# Patient Record
Sex: Female | Born: 1945 | Race: White | Hispanic: No | Marital: Married
Health system: Southern US, Community
[De-identification: ages and names within clinical notes are randomized; demographics above are authoritative.]

---

## 2004-07-13 ENCOUNTER — Ambulatory Visit (HOSPITAL_COMMUNITY): Admission: RE | Admit: 2004-07-13 | Discharge: 2004-07-13 | Payer: Self-pay | Admitting: Surgery

## 2004-07-13 ENCOUNTER — Encounter (INDEPENDENT_AMBULATORY_CARE_PROVIDER_SITE_OTHER): Payer: Self-pay | Admitting: Specialist

## 2004-07-21 ENCOUNTER — Ambulatory Visit (HOSPITAL_BASED_OUTPATIENT_CLINIC_OR_DEPARTMENT_OTHER): Admission: RE | Admit: 2004-07-21 | Discharge: 2004-07-21 | Payer: Self-pay | Admitting: General Surgery

## 2004-07-21 ENCOUNTER — Ambulatory Visit (HOSPITAL_COMMUNITY): Admission: RE | Admit: 2004-07-21 | Discharge: 2004-07-21 | Payer: Self-pay | Admitting: General Surgery

## 2005-05-17 ENCOUNTER — Encounter (INDEPENDENT_AMBULATORY_CARE_PROVIDER_SITE_OTHER): Payer: Self-pay | Admitting: Specialist

## 2005-05-17 ENCOUNTER — Ambulatory Visit (HOSPITAL_COMMUNITY): Admission: RE | Admit: 2005-05-17 | Discharge: 2005-05-17 | Payer: Self-pay | Admitting: Gastroenterology

## 2005-05-25 ENCOUNTER — Ambulatory Visit (HOSPITAL_COMMUNITY): Admission: RE | Admit: 2005-05-25 | Discharge: 2005-05-25 | Payer: Self-pay | Admitting: Gastroenterology

## 2005-06-03 ENCOUNTER — Ambulatory Visit (HOSPITAL_COMMUNITY): Admission: RE | Admit: 2005-06-03 | Discharge: 2005-06-03 | Payer: Self-pay | Admitting: Gastroenterology

## 2005-06-09 ENCOUNTER — Encounter: Admission: RE | Admit: 2005-06-09 | Discharge: 2005-06-09 | Payer: Self-pay | Admitting: Gastroenterology

## 2006-08-26 ENCOUNTER — Encounter: Admission: RE | Admit: 2006-08-26 | Discharge: 2006-08-26 | Payer: Self-pay | Admitting: Obstetrics and Gynecology

## 2007-02-24 ENCOUNTER — Encounter: Admission: RE | Admit: 2007-02-24 | Discharge: 2007-02-24 | Payer: Self-pay | Admitting: Obstetrics and Gynecology

## 2007-08-25 ENCOUNTER — Encounter: Admission: RE | Admit: 2007-08-25 | Discharge: 2007-08-25 | Payer: Self-pay | Admitting: Internal Medicine

## 2008-03-22 ENCOUNTER — Encounter: Admission: RE | Admit: 2008-03-22 | Discharge: 2008-03-22 | Payer: Self-pay | Admitting: Internal Medicine

## 2008-09-13 ENCOUNTER — Encounter: Admission: RE | Admit: 2008-09-13 | Discharge: 2008-09-13 | Payer: Self-pay | Admitting: Internal Medicine

## 2009-09-19 ENCOUNTER — Encounter: Admission: RE | Admit: 2009-09-19 | Discharge: 2009-09-19 | Payer: Self-pay | Admitting: Internal Medicine

## 2010-09-21 ENCOUNTER — Encounter
Admission: RE | Admit: 2010-09-21 | Discharge: 2010-09-21 | Payer: Self-pay | Source: Home / Self Care | Attending: Internal Medicine | Admitting: Internal Medicine

## 2011-02-19 NOTE — Op Note (Signed)
[name], [name]             ACCOUNT NO.:  [id number]   MEDICAL RECORD NO.:  [id number]          PATIENT TYPE:  AMB   LOCATION:  ENDO                         FACILITY:  [facility]   PHYSICIAN:  Sandria Bales. Ezzard Standing, M.D.  DATE OF BIRTH:  1946-05-22   DATE OF PROCEDURE:  07/13/2004  DATE OF DISCHARGE:                                 OPERATIVE REPORT   PROCEDURE:  Flexible colonoscopy with hot biopsy of 2 colonic polyps.   PREOPERATIVE DIAGNOSIS:  Some change in bowel habits and age 29.   POSTOPERATIVE DIAGNOSIS:  Colon polyps at 100 cm, at 80 cm and significant  sigmoid colon diverticulosis.   SURGEON:  Sandria Bales. Ezzard Standing, M.D.   ANESTHESIA:  Demerol 75 mg, Versed 6 mg.   COMPLICATIONS:  None.   INDICATIONS FOR PROCEDURE:  [name] is a 65 year old white female who  has seen Dr. Gabrielle Dare. [name] for a large right inguinal hernia.  She has  also had some very vague GI complaints in which she has not eaten well and  has lost significant weight.  She has already been through an upper GI and  abdominal ultrasound, and now comes for a colonoscopy because of this weight  loss and maybe a change in bowel habits.  She completed a GoLYTELY bowel  prep at home.   DESCRIPTION OF PROCEDURE:  A flexible Olympus colonoscope was passed through  her sigmoid colon.  She had a lot of spasm of her sigmoid colon and has a  lot of diverticulosis of her sigmoid colon, which it took some time to  advance the scope through the sigmoid colon, however, once I got past the  sigmoid colon, I got around the right colon, transverse colon and left  colon.  I identified the ileocecal valve at about 100 cm and at about 80 cm,  I found approximate 8- to 10-mm polyps, both of which I burned with a hot  biopsy forceps to biopsy both of these polyps.  Her right colon was  unremarkable.  Her transverse colon did have a couple of diverticula also.  The left  colon had a few diverticula, but her sigmoid colon had  significant  diverticulosis.  The scope was retroflexed within the rectum.  There was no  other mucosal lesion or mass noted.   The patient tolerated the procedure well.  She was monitored during the  procedure with a pulse oximetry, EKG, blood pressure cuff and had 2 L of  nasal O2, and again was given a total of 75 mg of Demerol and 6 mg of  Versed.   Again, it may be noted that it was somewhat difficult to get to her sigmoid  colon, so that if she has to be done again, she will need to be pretty  heavily sedated and consider maybe a pediatric colonoscope in doing her.      Davi   DHN/MEDQ  D:  07/13/2004  T:  07/14/2004  Job:  [id number]   cc:   Nolon Nations M.D.   Gabrielle Dare [name], M.D.  [facility]  [address]  [place], [place] [address]  Fax: [phone]

## 2011-02-19 NOTE — Op Note (Signed)
[name], [name]             ACCOUNT NO.:  [id number]   MEDICAL RECORD NO.:  [id number]          PATIENT TYPE:  AMB   LOCATION:  DSC                          FACILITY:  [facility]   PHYSICIAN:  Gabrielle Dare. [name], M.D.DATE OF BIRTH:  11/02/1945   DATE OF PROCEDURE:  07/21/2004  DATE OF DISCHARGE:                                 OPERATIVE REPORT   PREOPERATIVE DIAGNOSIS:  Right inguinal hernia.   POSTOPERATIVE DIAGNOSIS:  Right inguinal hernia.   PROCEDURE:  Repair of right inguinal hernia with mesh.   SURGEON:  Gabrielle Dare. [name], M.D.   ANESTHESIA:  MAC.   INDICATIONS FOR PROCEDURE:  The patient is a 65 year old white female whom I  evaluated in the office for a large right inguinal hernia.  Preoperative  studies had noted this to contain small bowel.  She underwent a colonoscopy  preoperatively by Dr. Sandria Bales. 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  No other abnormalities  were noted, and she presents for an elective repair of her hernia today.   DESCRIPTION OF PROCEDURE:  An informed consent was obtained.  The patient  received intravenous antibiotics.  She was brought to the operating room.  MAC anesthesia was administered.  Her lower abdomen and right groin were  prepped and draped in a sterile fashion.  A right groin incision was made.  The subcutaneous tissues were dissected out, revealing the external oblique.  This was divided, and the division was continued down through the external  ring.  The superior leaflet of the external oblique was dissected bluntly  from the aponeurosis, and the inferior oblique was dissected bluntly from  the hernia sac, revealing the shelving edge of the inguinal ligaments.  The  exploration of the inguinal area revealed a large direct hernia.  The sac  was dissected free from the surrounding structures, as it had some  significant adhesions.  These were all freed up without injuring the  sac,  and the sac easily reduced back into the abdomen.  A figure-of-eight stay  suture was placed between the shelving edge of the inguinal ligament and the  aponeurosis to hold the direct hernia reduced during the repair.  At this  time the repair was completed with a polypropylene mesh.  This was fashioned  into a bullet shape and this was secured medially to the tissues surrounding  the pubic tubercle with #0 Prolene suture, and that was sutured in a running  fashion inferiorly from the mesh to the shelving edge of the inguinal  ligament.  This extended well lateral to the hernia defect.  Once this was  accomplished, the superior portion of the mesh was packed down to the  aponeurosis with a series of interrupted #0 Prolene sutures beginning right  over the pubic tubercle and progressing out laterally past the defect,  creating a good circumferential repair with the mesh.  Once this was  accomplished, the area was copiously irrigated.  Then 0.25% Marcaine with  epinephrine was injected for postoperative pain relief.  Meticulous  hemostasis was assured.  The  area was again irrigated, and the external  oblique was reapproximated with a running #2-0 Vicryl suture.  Scarpa's  fascia was then approximated with a series of interrupted #3-0 Vicryl  sutures.  The subcutaneous tissues were again irrigated.  Some additional  local anesthetic was put in, and hemostasis was assured.  The skin was then  closed with a running #4-0 Monocryl subcuticular stitch.  The sponge, needle  and instrument counts were  all correct.  Benzoin and Steri-Strips and sterile dressings were applied.  The patient tolerated the procedure well without apparently complications,  and was taken to the recovery room in stable condition.       BET/MEDQ  D:  07/21/2004  T:  07/21/2004  Job:  [id number]

## 2011-08-23 ENCOUNTER — Other Ambulatory Visit (HOSPITAL_BASED_OUTPATIENT_CLINIC_OR_DEPARTMENT_OTHER): Payer: Self-pay | Admitting: Internal Medicine

## 2011-08-23 DIAGNOSIS — Z1231 Encounter for screening mammogram for malignant neoplasm of breast: Secondary | ICD-10-CM

## 2011-09-24 ENCOUNTER — Ambulatory Visit (HOSPITAL_BASED_OUTPATIENT_CLINIC_OR_DEPARTMENT_OTHER)
Admission: RE | Admit: 2011-09-24 | Discharge: 2011-09-24 | Disposition: A | Discharge status: Home / Self Care | Payer: Medicare Other | Source: Ambulatory Visit | Attending: Internal Medicine | Admitting: Internal Medicine

## 2011-09-24 DIAGNOSIS — Z1231 Encounter for screening mammogram for malignant neoplasm of breast: Secondary | ICD-10-CM | POA: Insufficient documentation

## 2012-08-24 ENCOUNTER — Other Ambulatory Visit (HOSPITAL_BASED_OUTPATIENT_CLINIC_OR_DEPARTMENT_OTHER): Payer: Self-pay | Admitting: Internal Medicine

## 2012-08-24 DIAGNOSIS — Z1231 Encounter for screening mammogram for malignant neoplasm of breast: Secondary | ICD-10-CM

## 2012-10-20 ENCOUNTER — Inpatient Hospital Stay (HOSPITAL_BASED_OUTPATIENT_CLINIC_OR_DEPARTMENT_OTHER): Admission: RE | Admit: 2012-10-20 | Payer: Medicare Other | Source: Ambulatory Visit

## 2012-10-27 ENCOUNTER — Ambulatory Visit (HOSPITAL_BASED_OUTPATIENT_CLINIC_OR_DEPARTMENT_OTHER)
Admission: RE | Admit: 2012-10-27 | Discharge: 2012-10-27 | Disposition: A | Discharge status: Home / Self Care | Payer: Medicare Other | Source: Ambulatory Visit | Attending: Internal Medicine | Admitting: Internal Medicine

## 2012-10-27 DIAGNOSIS — Z1231 Encounter for screening mammogram for malignant neoplasm of breast: Secondary | ICD-10-CM | POA: Insufficient documentation

## 2013-10-12 ENCOUNTER — Other Ambulatory Visit (HOSPITAL_BASED_OUTPATIENT_CLINIC_OR_DEPARTMENT_OTHER): Payer: Self-pay | Admitting: Internal Medicine

## 2013-10-12 DIAGNOSIS — Z1231 Encounter for screening mammogram for malignant neoplasm of breast: Secondary | ICD-10-CM

## 2013-11-02 ENCOUNTER — Ambulatory Visit (HOSPITAL_BASED_OUTPATIENT_CLINIC_OR_DEPARTMENT_OTHER): Payer: Medicare Other

## 2013-11-07 ENCOUNTER — Ambulatory Visit (HOSPITAL_BASED_OUTPATIENT_CLINIC_OR_DEPARTMENT_OTHER)
Admission: RE | Admit: 2013-11-07 | Discharge: 2013-11-07 | Disposition: A | Discharge status: Home / Self Care | Payer: Medicare Other | Source: Ambulatory Visit | Attending: Internal Medicine | Admitting: Internal Medicine

## 2013-11-07 DIAGNOSIS — Z1231 Encounter for screening mammogram for malignant neoplasm of breast: Secondary | ICD-10-CM | POA: Insufficient documentation

## 2014-10-10 ENCOUNTER — Other Ambulatory Visit (HOSPITAL_BASED_OUTPATIENT_CLINIC_OR_DEPARTMENT_OTHER): Payer: Self-pay | Admitting: Internal Medicine

## 2014-10-10 DIAGNOSIS — Z1231 Encounter for screening mammogram for malignant neoplasm of breast: Secondary | ICD-10-CM

## 2014-11-08 ENCOUNTER — Ambulatory Visit (HOSPITAL_BASED_OUTPATIENT_CLINIC_OR_DEPARTMENT_OTHER)
Admission: RE | Admit: 2014-11-08 | Discharge: 2014-11-08 | Disposition: A | Discharge status: Home / Self Care | Payer: Medicare Other | Source: Ambulatory Visit | Attending: Internal Medicine | Admitting: Internal Medicine

## 2014-11-08 DIAGNOSIS — Z1231 Encounter for screening mammogram for malignant neoplasm of breast: Secondary | ICD-10-CM | POA: Diagnosis present

## 2015-11-18 ENCOUNTER — Other Ambulatory Visit (HOSPITAL_BASED_OUTPATIENT_CLINIC_OR_DEPARTMENT_OTHER): Payer: Self-pay | Admitting: Internal Medicine

## 2015-11-18 DIAGNOSIS — Z1231 Encounter for screening mammogram for malignant neoplasm of breast: Secondary | ICD-10-CM

## 2015-11-25 ENCOUNTER — Ambulatory Visit (HOSPITAL_BASED_OUTPATIENT_CLINIC_OR_DEPARTMENT_OTHER)
Admission: RE | Admit: 2015-11-25 | Discharge: 2015-11-25 | Disposition: A | Discharge status: Home / Self Care | Payer: Medicare Other | Source: Ambulatory Visit | Attending: Internal Medicine | Admitting: Internal Medicine

## 2015-11-25 DIAGNOSIS — Z1231 Encounter for screening mammogram for malignant neoplasm of breast: Secondary | ICD-10-CM | POA: Diagnosis not present

## 2016-10-26 ENCOUNTER — Other Ambulatory Visit (HOSPITAL_BASED_OUTPATIENT_CLINIC_OR_DEPARTMENT_OTHER): Payer: Self-pay

## 2016-10-26 DIAGNOSIS — Z1231 Encounter for screening mammogram for malignant neoplasm of breast: Secondary | ICD-10-CM

## 2016-12-02 ENCOUNTER — Encounter (HOSPITAL_BASED_OUTPATIENT_CLINIC_OR_DEPARTMENT_OTHER): Payer: Self-pay

## 2016-12-02 ENCOUNTER — Ambulatory Visit (HOSPITAL_BASED_OUTPATIENT_CLINIC_OR_DEPARTMENT_OTHER)
Admission: RE | Admit: 2016-12-02 | Discharge: 2016-12-02 | Disposition: A | Discharge status: Home / Self Care | Payer: Medicare HMO | Source: Ambulatory Visit | Attending: Internal Medicine | Admitting: Internal Medicine

## 2016-12-02 DIAGNOSIS — Z1231 Encounter for screening mammogram for malignant neoplasm of breast: Secondary | ICD-10-CM | POA: Diagnosis present

## 2017-10-26 ENCOUNTER — Other Ambulatory Visit (HOSPITAL_BASED_OUTPATIENT_CLINIC_OR_DEPARTMENT_OTHER): Payer: Self-pay | Admitting: General Practice

## 2017-10-26 DIAGNOSIS — Z1231 Encounter for screening mammogram for malignant neoplasm of breast: Secondary | ICD-10-CM

## 2017-12-05 ENCOUNTER — Ambulatory Visit (HOSPITAL_BASED_OUTPATIENT_CLINIC_OR_DEPARTMENT_OTHER)
Admission: RE | Admit: 2017-12-05 | Discharge: 2017-12-05 | Disposition: A | Discharge status: Home / Self Care | Payer: Medicare HMO | Source: Ambulatory Visit | Attending: General Practice | Admitting: General Practice

## 2017-12-05 DIAGNOSIS — Z1231 Encounter for screening mammogram for malignant neoplasm of breast: Secondary | ICD-10-CM | POA: Insufficient documentation

## 2018-10-25 ENCOUNTER — Other Ambulatory Visit (HOSPITAL_BASED_OUTPATIENT_CLINIC_OR_DEPARTMENT_OTHER): Payer: Self-pay | Admitting: General Practice

## 2018-10-25 DIAGNOSIS — Z1231 Encounter for screening mammogram for malignant neoplasm of breast: Secondary | ICD-10-CM

## 2018-12-12 ENCOUNTER — Ambulatory Visit (HOSPITAL_BASED_OUTPATIENT_CLINIC_OR_DEPARTMENT_OTHER)
Admission: RE | Admit: 2018-12-12 | Discharge: 2018-12-12 | Disposition: A | Discharge status: Home / Self Care | Payer: Medicare HMO | Source: Ambulatory Visit | Attending: General Practice | Admitting: General Practice

## 2018-12-12 DIAGNOSIS — Z1231 Encounter for screening mammogram for malignant neoplasm of breast: Secondary | ICD-10-CM | POA: Diagnosis present

## 2019-10-26 ENCOUNTER — Ambulatory Visit: Payer: Medicare HMO | Attending: Internal Medicine

## 2019-10-26 DIAGNOSIS — Z23 Encounter for immunization: Secondary | ICD-10-CM | POA: Insufficient documentation

## 2019-10-26 NOTE — Progress Notes (Signed)
   Covid-19 Vaccination Clinic  Name:  [name]    MRN: [id number] DOB: [birth year]  10/26/2019  [name] was observed post Covid-19 immunization for 15 minutes without incidence. She was provided with Vaccine Information Sheet and instruction to access the V-Safe system.   [name] was instructed to call 911 with any severe reactions post vaccine: Marland Kitchen Difficulty breathing  . Swelling of your face and throat  . A fast heartbeat  . A bad rash all over your body  . Dizziness and weakness    Immunizations Administered    Name Date Dose VIS Date Route   Pfizer COVID-19 Vaccine 10/26/2019 10:52 AM 0.3 mL 09/14/2019 Intramuscular   Manufacturer: [employer], [employer]   Lot: [id number]   NDC: [address]-7

## 2019-11-12 ENCOUNTER — Other Ambulatory Visit (HOSPITAL_BASED_OUTPATIENT_CLINIC_OR_DEPARTMENT_OTHER): Payer: Self-pay | Admitting: General Practice

## 2019-11-12 DIAGNOSIS — Z1231 Encounter for screening mammogram for malignant neoplasm of breast: Secondary | ICD-10-CM

## 2019-11-16 ENCOUNTER — Ambulatory Visit: Payer: Medicare HMO | Attending: Internal Medicine

## 2019-11-16 DIAGNOSIS — Z23 Encounter for immunization: Secondary | ICD-10-CM | POA: Insufficient documentation

## 2019-11-16 NOTE — Progress Notes (Signed)
   Covid-19 Vaccination Clinic  Name:  [name]    MRN: [id number] DOB: 01-12-1946  11/16/2019  [name] was observed post Covid-19 immunization for 15 minutes without incidence. She was provided with Vaccine Information Sheet and instruction to access the V-Safe system.   [name] was instructed to call 911 with any severe reactions post vaccine: Marland Kitchen Difficulty breathing  . Swelling of your face and throat  . A fast heartbeat  . A bad rash all over your body  . Dizziness and weakness    Immunizations Administered    Name Date Dose VIS Date Route   Pfizer COVID-19 Vaccine 11/16/2019  2:13 PM 0.3 mL 09/14/2019 Intramuscular   Manufacturer: [employer], [employer]   Lot: [id number]   NDC: [address]-7

## 2019-12-18 ENCOUNTER — Ambulatory Visit (HOSPITAL_BASED_OUTPATIENT_CLINIC_OR_DEPARTMENT_OTHER)
Admission: RE | Admit: 2019-12-18 | Discharge: 2019-12-18 | Disposition: A | Discharge status: Home / Self Care | Payer: Medicare HMO | Source: Ambulatory Visit | Attending: General Practice | Admitting: General Practice

## 2019-12-18 ENCOUNTER — Other Ambulatory Visit: Payer: Self-pay

## 2019-12-18 ENCOUNTER — Encounter (HOSPITAL_BASED_OUTPATIENT_CLINIC_OR_DEPARTMENT_OTHER): Payer: Self-pay

## 2019-12-18 DIAGNOSIS — Z1231 Encounter for screening mammogram for malignant neoplasm of breast: Secondary | ICD-10-CM | POA: Diagnosis not present

## 2020-03-28 IMAGING — MG DIGITAL SCREENING BILATERAL MAMMOGRAM WITH TOMO AND CAD
8 series · 9 of 24 positions shown · non-contrast
Comparison: Previous exam(s).

CLINICAL DATA: Screening.

EXAM:
DIGITAL SCREENING BILATERAL MAMMOGRAM WITH TOMO AND CAD

[L CC synth-2D]
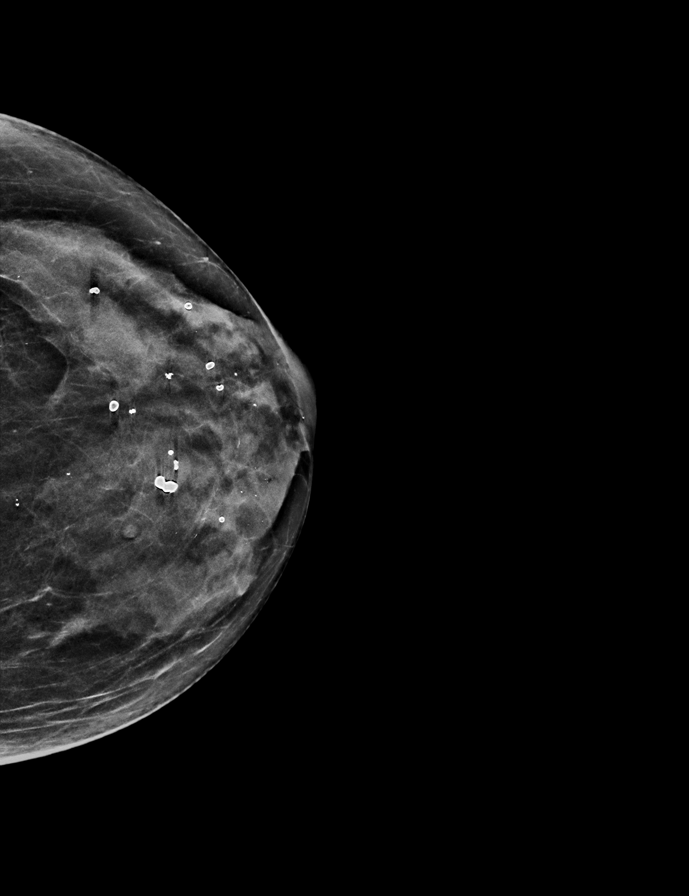

[R MLO synth-2D]
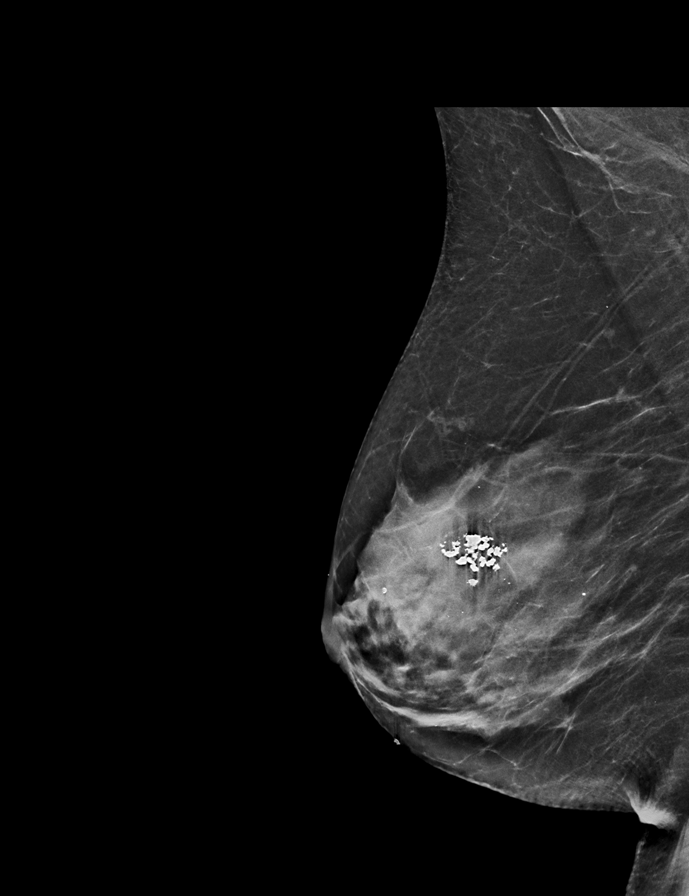

[R CC synth-2D]
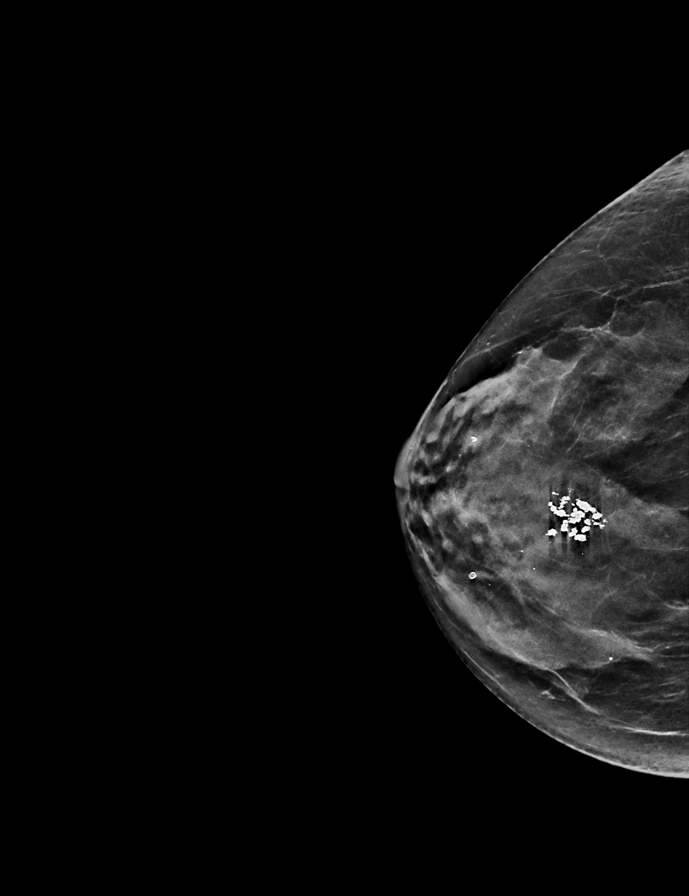

[L MLO synth-2D]
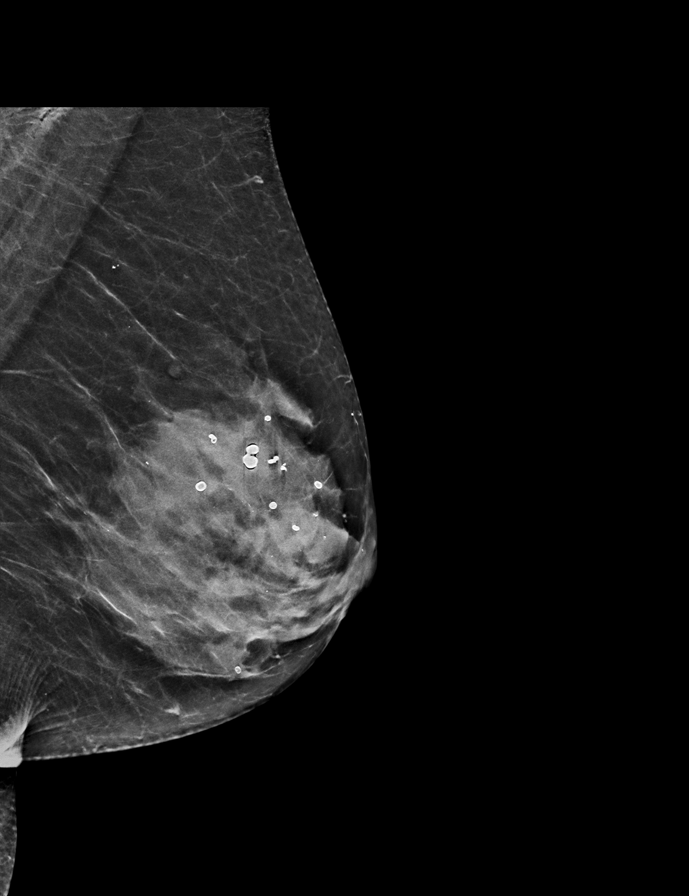

[L CC tomo · 2 of 47 frames shown]
[frame 16/47]
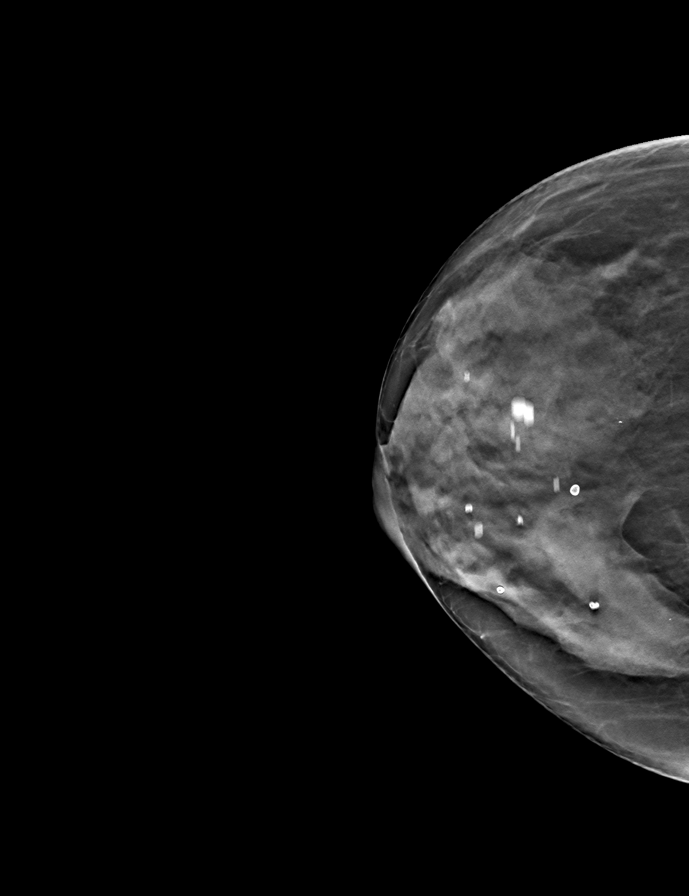
[frame 24/47]
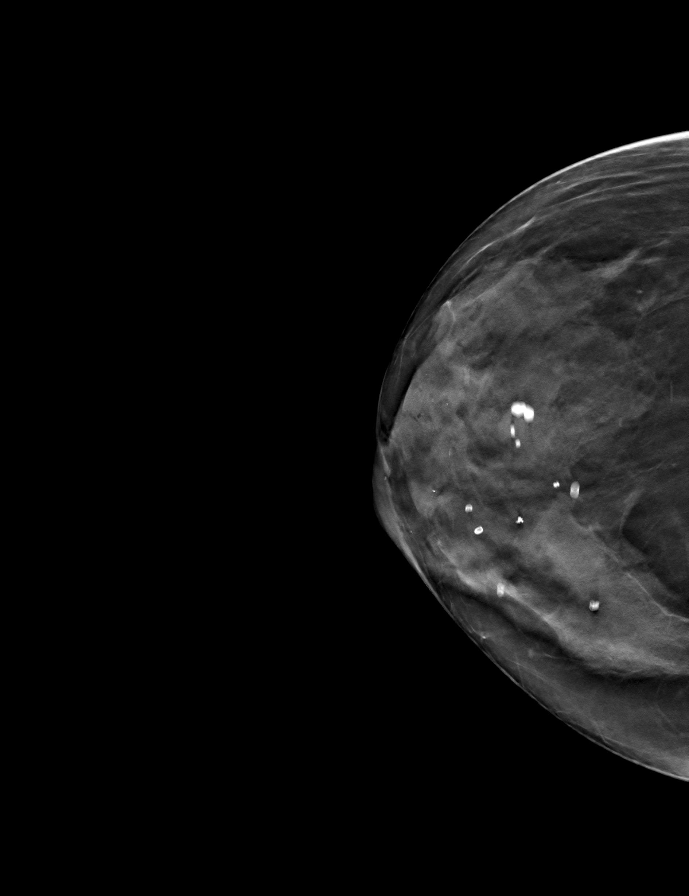

[R MLO tomo · tomo slice 27/54.0]
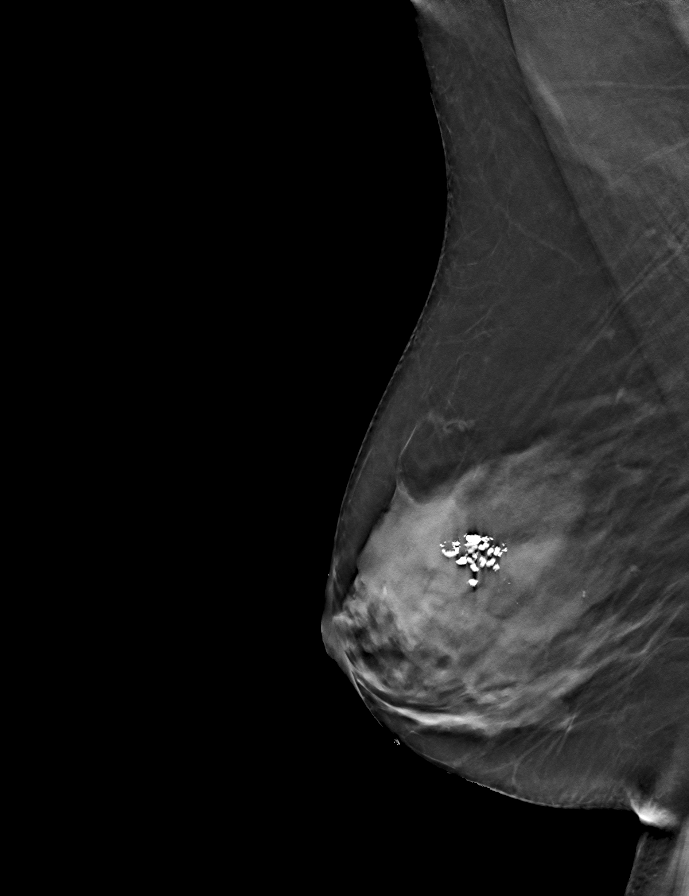

[L MLO tomo · tomo slice 27/54.0]
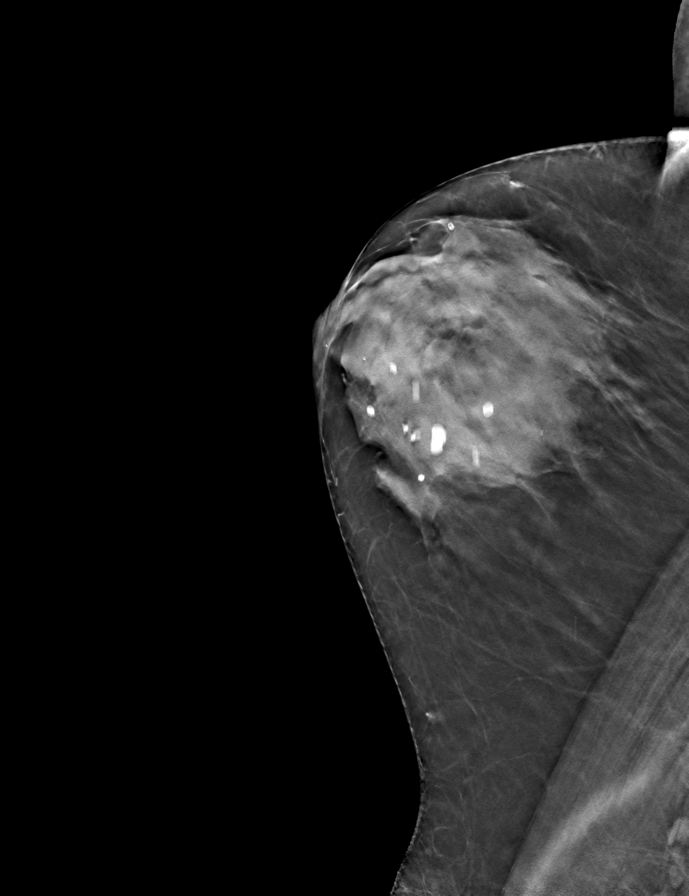

[R CC tomo · tomo slice 23/45.0]
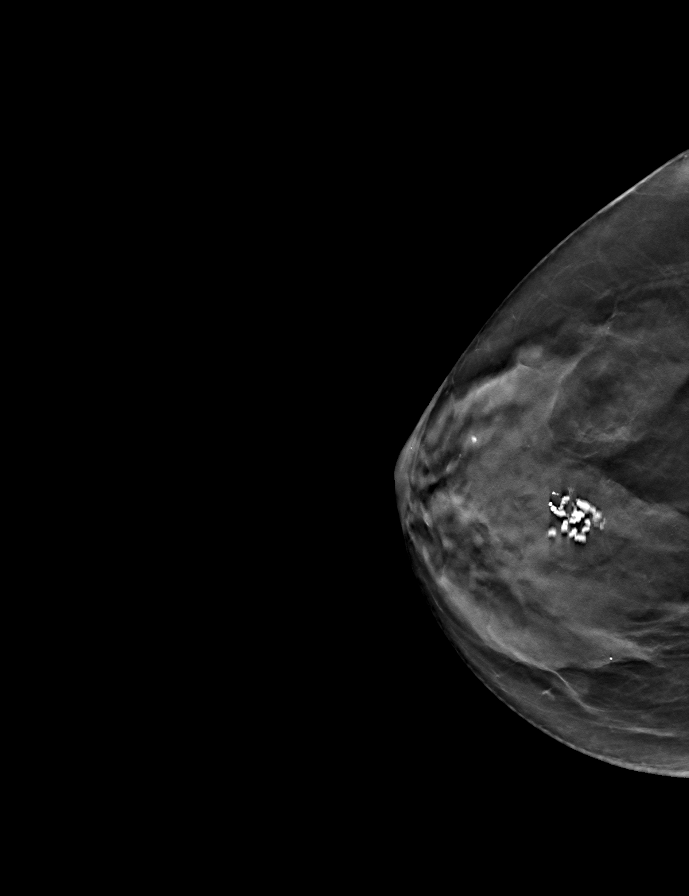

[9 of 24 positions shown; findings below may reference images not displayed]

ACR Breast Density Category c: The breast tissue is heterogeneously
dense, which may obscure small masses.
FINDINGS: There are no findings suspicious for malignancy. Images were
processed with CAD.
IMPRESSION: No mammographic evidence of malignancy. A result letter of this
screening mammogram will be mailed directly to the patient.

RECOMMENDATION:
Screening mammogram in one year. (Code:[id number])

BI-RADS CATEGORY  1: Negative.

## 2020-11-10 ENCOUNTER — Other Ambulatory Visit (HOSPITAL_BASED_OUTPATIENT_CLINIC_OR_DEPARTMENT_OTHER): Payer: Self-pay | Admitting: General Practice

## 2020-11-10 DIAGNOSIS — Z1231 Encounter for screening mammogram for malignant neoplasm of breast: Secondary | ICD-10-CM

## 2020-12-22 ENCOUNTER — Ambulatory Visit (HOSPITAL_BASED_OUTPATIENT_CLINIC_OR_DEPARTMENT_OTHER)
Admission: RE | Admit: 2020-12-22 | Discharge: 2020-12-22 | Disposition: A | Discharge status: Home / Self Care | Payer: Medicare HMO | Source: Ambulatory Visit | Attending: General Practice | Admitting: General Practice

## 2020-12-22 ENCOUNTER — Other Ambulatory Visit: Payer: Self-pay

## 2020-12-22 ENCOUNTER — Encounter (HOSPITAL_BASED_OUTPATIENT_CLINIC_OR_DEPARTMENT_OTHER): Payer: Self-pay

## 2020-12-22 DIAGNOSIS — Z1231 Encounter for screening mammogram for malignant neoplasm of breast: Secondary | ICD-10-CM | POA: Diagnosis not present

## 2021-12-07 ENCOUNTER — Other Ambulatory Visit (HOSPITAL_BASED_OUTPATIENT_CLINIC_OR_DEPARTMENT_OTHER): Payer: Self-pay | Admitting: General Practice

## 2021-12-07 DIAGNOSIS — Z1231 Encounter for screening mammogram for malignant neoplasm of breast: Secondary | ICD-10-CM

## 2021-12-28 ENCOUNTER — Encounter (HOSPITAL_BASED_OUTPATIENT_CLINIC_OR_DEPARTMENT_OTHER): Payer: Self-pay

## 2021-12-28 ENCOUNTER — Other Ambulatory Visit: Payer: Self-pay

## 2021-12-28 ENCOUNTER — Ambulatory Visit (HOSPITAL_BASED_OUTPATIENT_CLINIC_OR_DEPARTMENT_OTHER)
Admission: RE | Admit: 2021-12-28 | Discharge: 2021-12-28 | Disposition: A | Discharge status: Home / Self Care | Payer: Medicare HMO | Source: Ambulatory Visit | Attending: General Practice | Admitting: General Practice

## 2021-12-28 DIAGNOSIS — Z1231 Encounter for screening mammogram for malignant neoplasm of breast: Secondary | ICD-10-CM | POA: Diagnosis not present

## 2022-11-29 ENCOUNTER — Other Ambulatory Visit (HOSPITAL_BASED_OUTPATIENT_CLINIC_OR_DEPARTMENT_OTHER): Payer: Self-pay | Admitting: General Practice

## 2022-11-29 DIAGNOSIS — Z1231 Encounter for screening mammogram for malignant neoplasm of breast: Secondary | ICD-10-CM

## 2023-01-11 ENCOUNTER — Encounter (HOSPITAL_BASED_OUTPATIENT_CLINIC_OR_DEPARTMENT_OTHER): Payer: Self-pay

## 2023-01-11 ENCOUNTER — Ambulatory Visit (HOSPITAL_BASED_OUTPATIENT_CLINIC_OR_DEPARTMENT_OTHER)
Admission: RE | Admit: 2023-01-11 | Discharge: 2023-01-11 | Disposition: A | Discharge status: Home / Self Care | Payer: Medicare HMO | Source: Ambulatory Visit | Attending: General Practice | Admitting: General Practice

## 2023-01-11 DIAGNOSIS — Z1231 Encounter for screening mammogram for malignant neoplasm of breast: Secondary | ICD-10-CM | POA: Insufficient documentation

## 2023-12-02 ENCOUNTER — Other Ambulatory Visit (HOSPITAL_BASED_OUTPATIENT_CLINIC_OR_DEPARTMENT_OTHER): Payer: Self-pay | Admitting: General Practice

## 2023-12-02 DIAGNOSIS — Z139 Encounter for screening, unspecified: Secondary | ICD-10-CM

## 2024-01-17 ENCOUNTER — Ambulatory Visit (HOSPITAL_BASED_OUTPATIENT_CLINIC_OR_DEPARTMENT_OTHER)
Admission: RE | Admit: 2024-01-17 | Discharge: 2024-01-17 | Disposition: A | Discharge status: Home / Self Care | Payer: Medicare HMO | Source: Ambulatory Visit | Attending: General Practice | Admitting: General Practice

## 2024-01-17 DIAGNOSIS — Z1231 Encounter for screening mammogram for malignant neoplasm of breast: Secondary | ICD-10-CM | POA: Diagnosis not present

## 2024-01-17 DIAGNOSIS — Z139 Encounter for screening, unspecified: Secondary | ICD-10-CM | POA: Diagnosis present
